# Patient Record
Sex: Male | Born: 1985 | Race: Black or African American | Hispanic: No | Marital: Single | Smoking: Never smoker
Health system: Southern US, Community
[De-identification: ages and names within clinical notes are randomized; demographics above are authoritative.]

---

## 2019-02-14 ENCOUNTER — Emergency Department (HOSPITAL_COMMUNITY)
Admission: EM | Admit: 2019-02-14 | Discharge: 2019-02-14 | Disposition: A | Discharge status: Home / Self Care | Payer: No Typology Code available for payment source | Attending: Emergency Medicine | Admitting: Emergency Medicine

## 2019-02-14 ENCOUNTER — Emergency Department (HOSPITAL_COMMUNITY): Payer: No Typology Code available for payment source

## 2019-02-14 ENCOUNTER — Other Ambulatory Visit: Payer: Self-pay

## 2019-02-14 ENCOUNTER — Encounter (HOSPITAL_COMMUNITY): Payer: Self-pay

## 2019-02-14 DIAGNOSIS — R110 Nausea: Secondary | ICD-10-CM | POA: Insufficient documentation

## 2019-02-14 DIAGNOSIS — S0231XA Fracture of orbital floor, right side, initial encounter for closed fracture: Secondary | ICD-10-CM | POA: Insufficient documentation

## 2019-02-14 DIAGNOSIS — Y939 Activity, unspecified: Secondary | ICD-10-CM | POA: Diagnosis not present

## 2019-02-14 DIAGNOSIS — S0230XA Fracture of orbital floor, unspecified side, initial encounter for closed fracture: Secondary | ICD-10-CM

## 2019-02-14 DIAGNOSIS — S0990XA Unspecified injury of head, initial encounter: Secondary | ICD-10-CM | POA: Diagnosis present

## 2019-02-14 DIAGNOSIS — Y999 Unspecified external cause status: Secondary | ICD-10-CM | POA: Insufficient documentation

## 2019-02-14 DIAGNOSIS — G44209 Tension-type headache, unspecified, not intractable: Secondary | ICD-10-CM | POA: Insufficient documentation

## 2019-02-14 DIAGNOSIS — Y929 Unspecified place or not applicable: Secondary | ICD-10-CM | POA: Diagnosis not present

## 2019-02-14 DIAGNOSIS — H532 Diplopia: Secondary | ICD-10-CM | POA: Insufficient documentation

## 2019-02-14 DIAGNOSIS — R519 Headache, unspecified: Secondary | ICD-10-CM

## 2019-02-14 DIAGNOSIS — H5789 Other specified disorders of eye and adnexa: Secondary | ICD-10-CM

## 2019-02-14 DIAGNOSIS — R51 Headache: Secondary | ICD-10-CM

## 2019-02-14 LAB — CBC WITH DIFFERENTIAL/PLATELET
Abs Immature Granulocytes: 0.08 10*3/uL — ABNORMAL HIGH (ref 0.00–0.07)
BASOS ABS: 0 10*3/uL (ref 0.0–0.1)
Basophils Relative: 0 %
EOS PCT: 0 %
Eosinophils Absolute: 0 10*3/uL (ref 0.0–0.5)
HCT: 42.6 % (ref 39.0–52.0)
Hemoglobin: 13.4 g/dL (ref 13.0–17.0)
Immature Granulocytes: 1 %
LYMPHS PCT: 9 %
Lymphs Abs: 0.9 10*3/uL (ref 0.7–4.0)
MCH: 26.5 pg (ref 26.0–34.0)
MCHC: 31.5 g/dL (ref 30.0–36.0)
MCV: 84.2 fL (ref 80.0–100.0)
Monocytes Absolute: 0.5 10*3/uL (ref 0.1–1.0)
Monocytes Relative: 5 %
NRBC: 0 % (ref 0.0–0.2)
Neutro Abs: 8.8 10*3/uL — ABNORMAL HIGH (ref 1.7–7.7)
Neutrophils Relative %: 85 %
Platelets: 232 10*3/uL (ref 150–400)
RBC: 5.06 MIL/uL (ref 4.22–5.81)
RDW: 13.6 % (ref 11.5–15.5)
WBC: 10.4 10*3/uL (ref 4.0–10.5)

## 2019-02-14 LAB — BASIC METABOLIC PANEL
Anion gap: 7 (ref 5–15)
BUN: 9 mg/dL (ref 6–20)
CO2: 24 mmol/L (ref 22–32)
CREATININE: 0.97 mg/dL (ref 0.61–1.24)
Calcium: 10.5 mg/dL — ABNORMAL HIGH (ref 8.9–10.3)
Chloride: 108 mmol/L (ref 98–111)
GFR calc Af Amer: >60 mL/min (ref >60)
GFR calc non Af Amer: >60 mL/min (ref >60)
Glucose, Bld: 109 mg/dL — ABNORMAL HIGH (ref 70–99)
Potassium: 4.4 mmol/L (ref 3.5–5.1)
Sodium: 139 mmol/L (ref 135–145)

## 2019-02-14 MED ORDER — ONDANSETRON HCL 4 MG/2ML IJ SOLN
4.0000 mg | Freq: Once | INTRAMUSCULAR | Status: AC
  Administered 2019-02-14: 4 mg via INTRAVENOUS
  Filled 2019-02-14: qty 2

## 2019-02-14 MED ORDER — HYDROMORPHONE HCL 1 MG/ML IJ SOLN
1.0000 mg | Freq: Once | INTRAMUSCULAR | Status: AC
  Administered 2019-02-14: 1 mg via INTRAVENOUS
  Filled 2019-02-14: qty 1

## 2019-02-14 MED ORDER — HYDROMORPHONE HCL 1 MG/ML IJ SOLN
0.5000 mg | Freq: Once | INTRAMUSCULAR | Status: AC
  Administered 2019-02-14: 0.5 mg via INTRAVENOUS
  Filled 2019-02-14: qty 1
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
## 2019-02-14 NOTE — ED Notes (Signed)
[facility] line called per Dr Rush Landmark

## 2019-02-14 NOTE — ED Notes (Signed)
Employer requesting post incident drug screen for Workers Comp

## 2019-02-14 NOTE — ED Notes (Signed)
Pt estimated weight is 303
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
## 2019-02-14 NOTE — ED Notes (Signed)
Phone call complete with CT, requested disc to made for CT.
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
## 2020-12-12 ENCOUNTER — Other Ambulatory Visit: Payer: Self-pay | Admitting: Nurse Practitioner

## 2020-12-12 ENCOUNTER — Other Ambulatory Visit: Payer: Self-pay

## 2020-12-12 ENCOUNTER — Ambulatory Visit
Admission: RE | Admit: 2020-12-12 | Discharge: 2020-12-12 | Disposition: A | Discharge status: Home / Self Care | Payer: No Typology Code available for payment source | Source: Ambulatory Visit | Attending: Nurse Practitioner | Admitting: Nurse Practitioner

## 2020-12-12 DIAGNOSIS — M79671 Pain in right foot: Secondary | ICD-10-CM

## 2020-12-12 DIAGNOSIS — S9031XA Contusion of right foot, initial encounter: Secondary | ICD-10-CM

## 2023-07-22 ENCOUNTER — Other Ambulatory Visit: Payer: Self-pay | Admitting: General Surgery

## 2023-07-22 DIAGNOSIS — K4091 Unilateral inguinal hernia, without obstruction or gangrene, recurrent: Secondary | ICD-10-CM

## 2023-08-11 ENCOUNTER — Other Ambulatory Visit: Payer: 59

## 2023-08-13 ENCOUNTER — Ambulatory Visit
Admission: RE | Admit: 2023-08-13 | Discharge: 2023-08-13 | Disposition: A | Discharge status: Home / Self Care | Payer: 59 | Source: Ambulatory Visit | Attending: General Surgery | Admitting: General Surgery

## 2023-08-13 DIAGNOSIS — K4091 Unilateral inguinal hernia, without obstruction or gangrene, recurrent: Secondary | ICD-10-CM
